# Patient Record
Sex: Female | Born: 1980 | Race: White | Hispanic: No | Marital: Married
Health system: Southern US, Community
[De-identification: ages and names within clinical notes are randomized; demographics above are authoritative.]

---

## 2015-11-16 ENCOUNTER — Other Ambulatory Visit: Payer: Self-pay | Admitting: Nurse Practitioner

## 2015-11-16 DIAGNOSIS — N63 Unspecified lump in unspecified breast: Secondary | ICD-10-CM

## 2015-12-06 ENCOUNTER — Ambulatory Visit
Admission: RE | Admit: 2015-12-06 | Discharge: 2015-12-06 | Disposition: A | Discharge status: Home / Self Care | Payer: BLUE CROSS/BLUE SHIELD | Source: Ambulatory Visit | Attending: Nurse Practitioner | Admitting: Nurse Practitioner

## 2015-12-06 ENCOUNTER — Encounter: Payer: Self-pay | Admitting: Radiology

## 2015-12-06 DIAGNOSIS — N63 Unspecified lump in unspecified breast: Secondary | ICD-10-CM

## 2016-02-24 ENCOUNTER — Emergency Department: Payer: BLUE CROSS/BLUE SHIELD

## 2016-02-24 ENCOUNTER — Emergency Department
Admission: EM | Admit: 2016-02-24 | Discharge: 2016-02-24 | Disposition: A | Discharge status: Home / Self Care | Payer: BLUE CROSS/BLUE SHIELD | Attending: Emergency Medicine | Admitting: Emergency Medicine

## 2016-02-24 ENCOUNTER — Encounter: Payer: Self-pay | Admitting: Emergency Medicine

## 2016-02-24 DIAGNOSIS — Z79899 Other long term (current) drug therapy: Secondary | ICD-10-CM | POA: Diagnosis not present

## 2016-02-24 DIAGNOSIS — S4991XA Unspecified injury of right shoulder and upper arm, initial encounter: Secondary | ICD-10-CM | POA: Diagnosis present

## 2016-02-24 DIAGNOSIS — Y999 Unspecified external cause status: Secondary | ICD-10-CM | POA: Insufficient documentation

## 2016-02-24 DIAGNOSIS — Y929 Unspecified place or not applicable: Secondary | ICD-10-CM | POA: Diagnosis not present

## 2016-02-24 DIAGNOSIS — S40021A Contusion of right upper arm, initial encounter: Secondary | ICD-10-CM | POA: Diagnosis not present

## 2016-02-24 DIAGNOSIS — Y939 Activity, unspecified: Secondary | ICD-10-CM | POA: Diagnosis not present

## 2016-02-24 MED ORDER — NAPROXEN 500 MG PO TABS: 500.0000 mg | ORAL_TABLET | Freq: Two times a day (BID) | ORAL | 0 refills | Status: AC

## 2016-02-24 MED ORDER — HYDROCODONE-ACETAMINOPHEN 5-325 MG PO TABS: 1.0000 | ORAL_TABLET | Freq: Four times a day (QID) | ORAL | 0 refills | Status: AC | PRN
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
## 2018-02-20 IMAGING — MG MM DIGITAL DIAGNOSTIC BILAT W/ TOMO W/ CAD
8 of 15 series · 8 of 35 positions shown · non-contrast
Comparison: None

CLINICAL DATA: Thickening noted within the superior portion of the
left breast. Family history of breast cancer in her maternal
grandmother (postmenopausal).

EXAM:
2D DIGITAL DIAGNOSTIC BILATERAL MAMMOGRAM WITH CAD AND ADJUNCT TOMO
ULTRASOUND LEFT BREAST

[L TAN synth-2D]
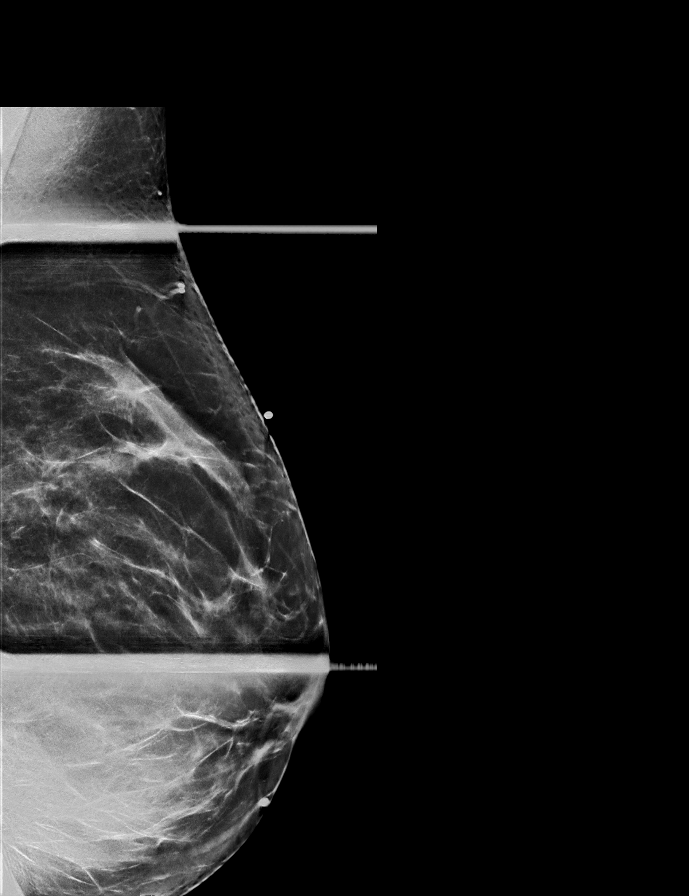

[R CC]
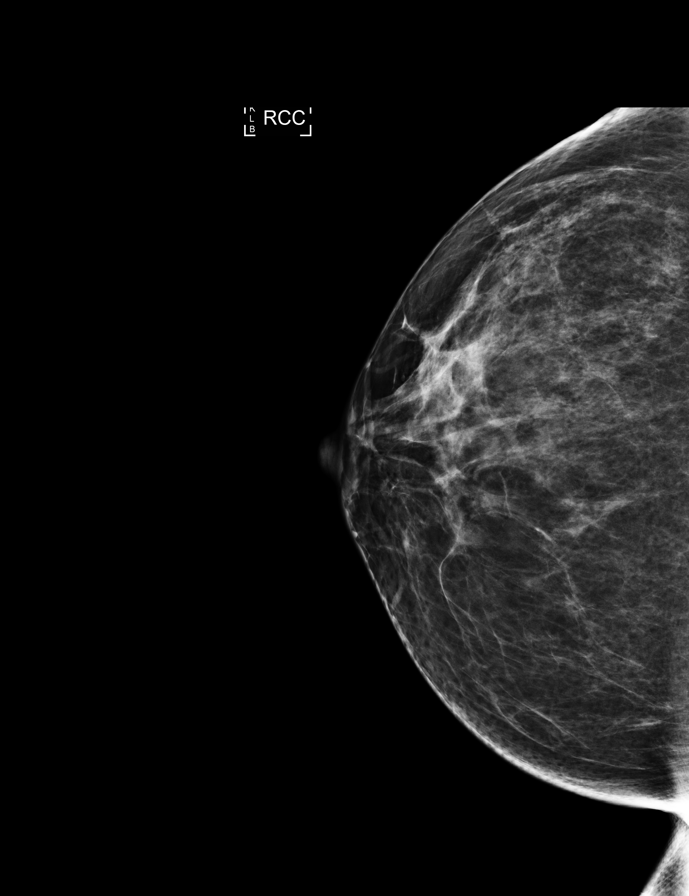

[R CC synth-2D]
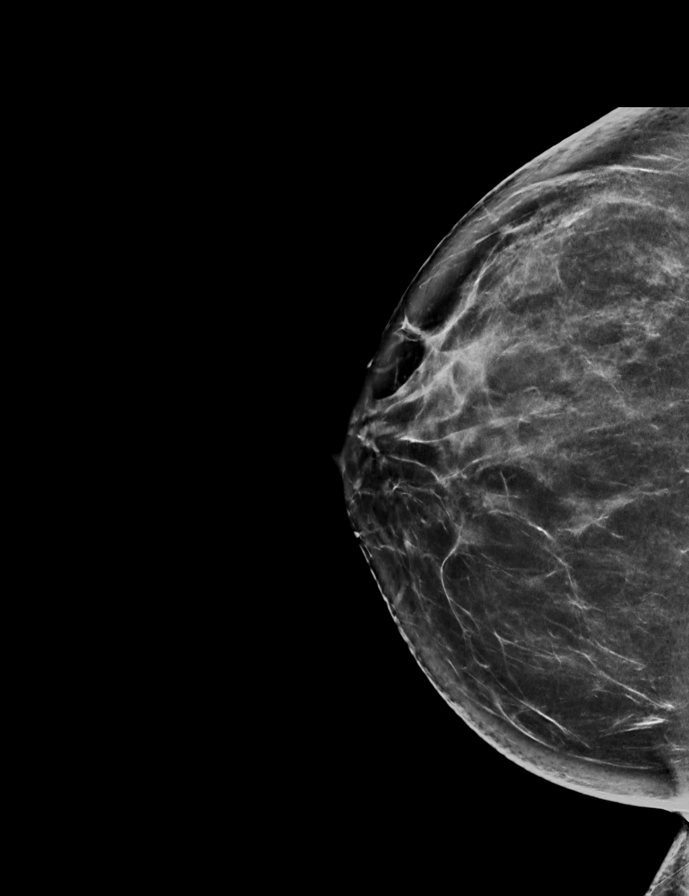

[R MLO synth-2D]
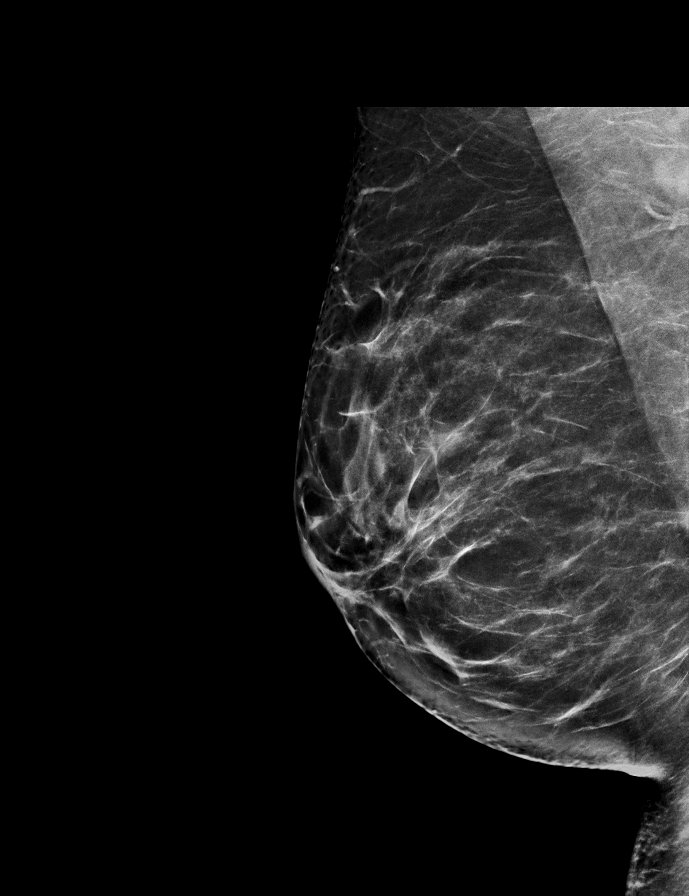

[L CC synth-2D]
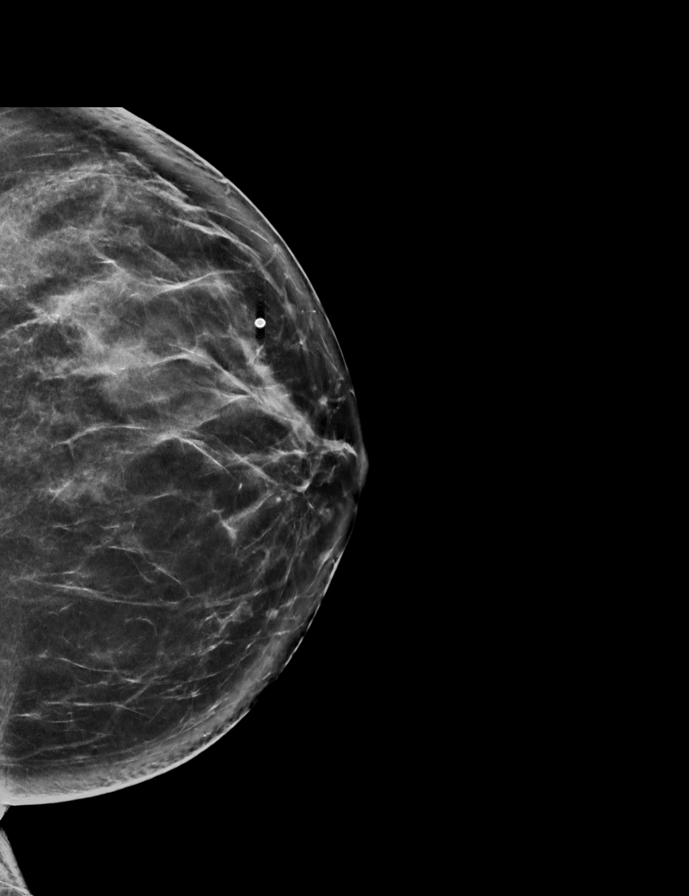

[R MLO]
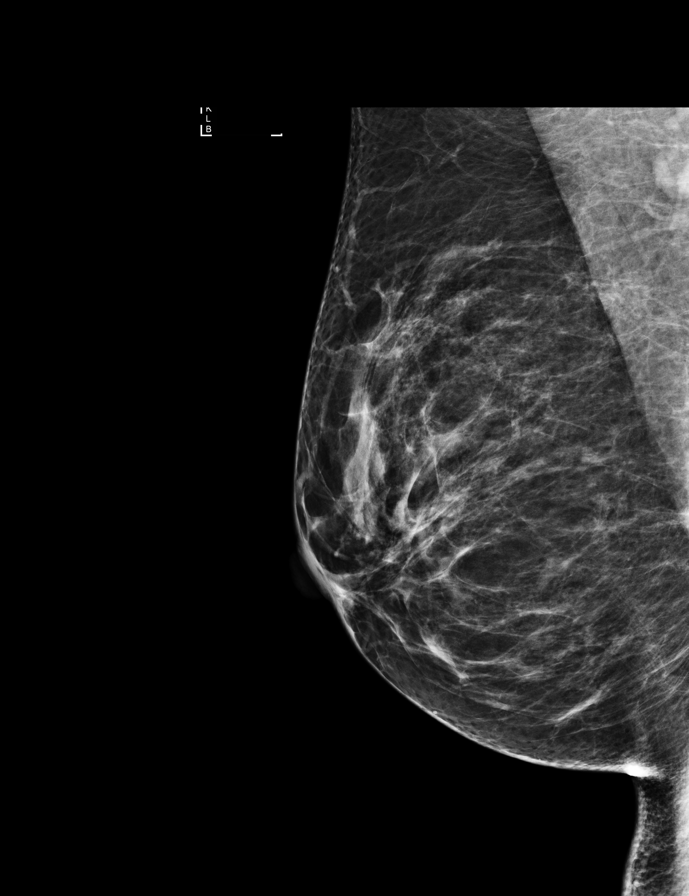

[L MLO synth-2D]
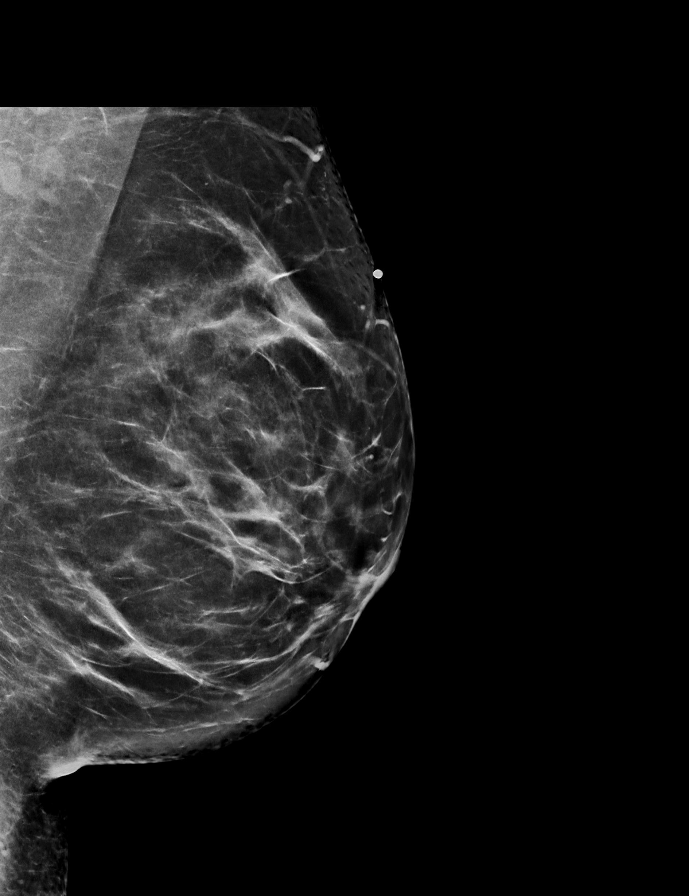

[L CC]
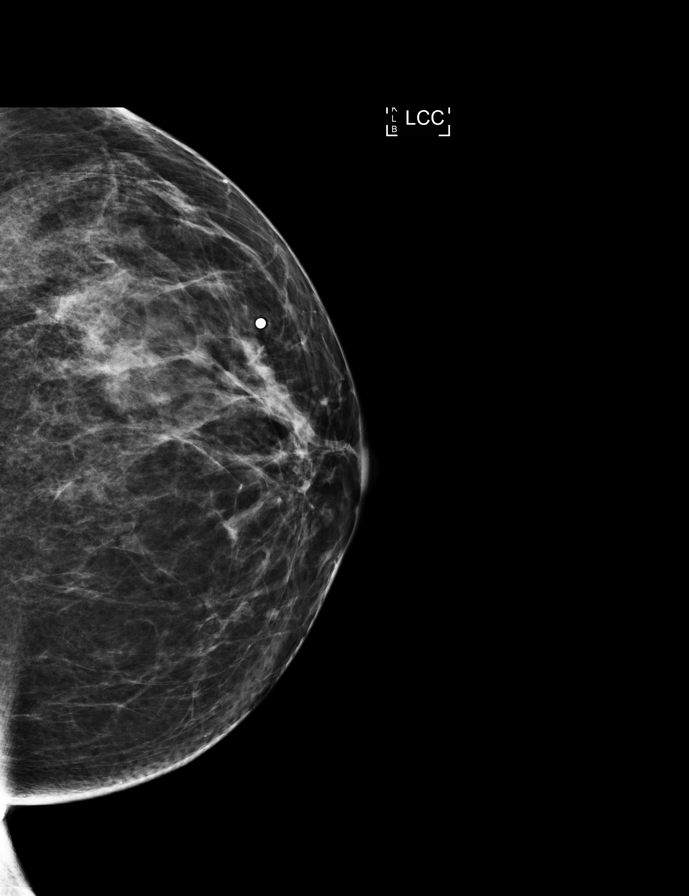

[8 of 35 positions shown; findings below may reference images not displayed]

ACR Breast Density Category b: There are scattered areas of
fibroglandular density.
FINDINGS: There is no mass, distortion, or worrisome calcification within
either breast. The breast tissue within the superior left breast is
mildly asymmetric and mildly more prominent than within the right
breast.

Mammographic images were processed with CAD.

On physical exam, there is no discrete palpable abnormality within
the superior left breast. There is mild thickening within the
superior left breast.

Targeted ultrasound is performed, showing normal appearing
fibroglandular tissue within the superior left breast. There is no
mass, cyst, distortion, or worrisome shadowing.
IMPRESSION: No findings worrisome for malignancy. Recommend screening
mammography at age 38. Breast self-examination was reviewed with the
patient.

RECOMMENDATION:
Bilateral screening mammography at age 38.

I have discussed the findings and recommendations with the patient.
Results were also provided in writing at the conclusion of the
visit. If applicable, a reminder letter will be sent to the patient
regarding the next appointment.

BI-RADS CATEGORY  1: Negative.
